# Patient Record
Sex: Female | Born: 1960 | Race: White | Hispanic: No | Marital: Married | Smoking: Never smoker
Health system: Southern US, Community
[De-identification: ages and names within clinical notes are randomized; demographics above are authoritative.]

## PROBLEM LIST (undated history)

## (undated) DIAGNOSIS — G43909 Migraine, unspecified, not intractable, without status migrainosus: Secondary | ICD-10-CM

## (undated) DIAGNOSIS — I10 Essential (primary) hypertension: Secondary | ICD-10-CM

## (undated) HISTORY — PX: OTHER SURGICAL HISTORY: SHX169

## (undated) HISTORY — DX: Migraine, unspecified, not intractable, without status migrainosus: G43.909

## (undated) HISTORY — DX: Essential (primary) hypertension: I10

---

## 2003-09-18 ENCOUNTER — Other Ambulatory Visit: Admission: RE | Admit: 2003-09-18 | Discharge: 2003-09-18 | Payer: Self-pay | Admitting: Obstetrics and Gynecology

## 2004-10-23 ENCOUNTER — Other Ambulatory Visit: Admission: RE | Admit: 2004-10-23 | Discharge: 2004-10-23 | Payer: Self-pay | Admitting: Obstetrics and Gynecology

## 2005-01-14 ENCOUNTER — Other Ambulatory Visit: Admission: RE | Admit: 2005-01-14 | Discharge: 2005-01-14 | Payer: Self-pay | Admitting: Obstetrics and Gynecology

## 2005-07-23 ENCOUNTER — Other Ambulatory Visit: Admission: RE | Admit: 2005-07-23 | Discharge: 2005-07-23 | Payer: Self-pay | Admitting: Obstetrics and Gynecology

## 2005-07-29 ENCOUNTER — Encounter: Admission: RE | Admit: 2005-07-29 | Discharge: 2005-07-29 | Payer: Self-pay | Admitting: Obstetrics and Gynecology

## 2011-06-20 ENCOUNTER — Other Ambulatory Visit: Payer: Self-pay | Admitting: Obstetrics and Gynecology

## 2011-06-20 DIAGNOSIS — R928 Other abnormal and inconclusive findings on diagnostic imaging of breast: Secondary | ICD-10-CM

## 2011-07-03 ENCOUNTER — Other Ambulatory Visit: Payer: Self-pay

## 2011-07-04 ENCOUNTER — Ambulatory Visit
Admission: RE | Admit: 2011-07-04 | Discharge: 2011-07-04 | Disposition: A | Discharge status: Home / Self Care | Payer: BC Managed Care – PPO | Source: Ambulatory Visit | Attending: Obstetrics and Gynecology | Admitting: Obstetrics and Gynecology

## 2011-07-04 DIAGNOSIS — R928 Other abnormal and inconclusive findings on diagnostic imaging of breast: Secondary | ICD-10-CM

## 2014-07-27 ENCOUNTER — Other Ambulatory Visit: Payer: Self-pay | Admitting: Cardiology

## 2014-07-27 DIAGNOSIS — R0609 Other forms of dyspnea: Secondary | ICD-10-CM

## 2014-07-27 DIAGNOSIS — R0989 Other specified symptoms and signs involving the circulatory and respiratory systems: Principal | ICD-10-CM

## 2014-08-02 ENCOUNTER — Encounter (INDEPENDENT_AMBULATORY_CARE_PROVIDER_SITE_OTHER): Payer: Self-pay

## 2014-08-02 ENCOUNTER — Ambulatory Visit
Admission: RE | Admit: 2014-08-02 | Discharge: 2014-08-02 | Disposition: A | Discharge status: Home / Self Care | Payer: No Typology Code available for payment source | Source: Ambulatory Visit | Attending: Cardiology | Admitting: Cardiology

## 2014-08-02 DIAGNOSIS — R0989 Other specified symptoms and signs involving the circulatory and respiratory systems: Principal | ICD-10-CM

## 2014-08-02 DIAGNOSIS — R0609 Other forms of dyspnea: Secondary | ICD-10-CM

## 2015-07-18 ENCOUNTER — Other Ambulatory Visit: Payer: Self-pay | Admitting: Obstetrics and Gynecology

## 2015-07-18 DIAGNOSIS — R928 Other abnormal and inconclusive findings on diagnostic imaging of breast: Secondary | ICD-10-CM

## 2015-07-23 ENCOUNTER — Ambulatory Visit
Admission: RE | Admit: 2015-07-23 | Discharge: 2015-07-23 | Disposition: A | Discharge status: Home / Self Care | Payer: BC Managed Care – PPO | Source: Ambulatory Visit | Attending: Obstetrics and Gynecology | Admitting: Obstetrics and Gynecology

## 2015-07-23 DIAGNOSIS — R928 Other abnormal and inconclusive findings on diagnostic imaging of breast: Secondary | ICD-10-CM

## 2015-07-23 LAB — HM MAMMOGRAPHY: HM Mammogram: ABNORMAL — AB (ref 0–4)

## 2015-07-25 ENCOUNTER — Ambulatory Visit: Payer: BC Managed Care – PPO | Admitting: Neurology

## 2015-09-09 IMAGING — CT CT HEART SCORING
1 of 2 series · 11 of 20 positions shown, 14 images · non-contrast
Comparison: None.

CLINICAL DATA: Shortness of breath. Family history of heart
disease.

EXAM:
CT HEART FOR CALCIUM SCORING
TECHNIQUE: CT heart was performed on a 256 channel system using prospective ECG
gating. A scout and noncontrast exam (for calcium scoring) were
performed. Note that this exam targets the heart and the chest was
not imaged in its entirety.

[Series 2: smartscore - gated 0.4 sec · axial · 0.49mm/px · z∈[-211,-96]mm · 11 of 56 slices shown, 14 images]
[im 5/56  vessel]
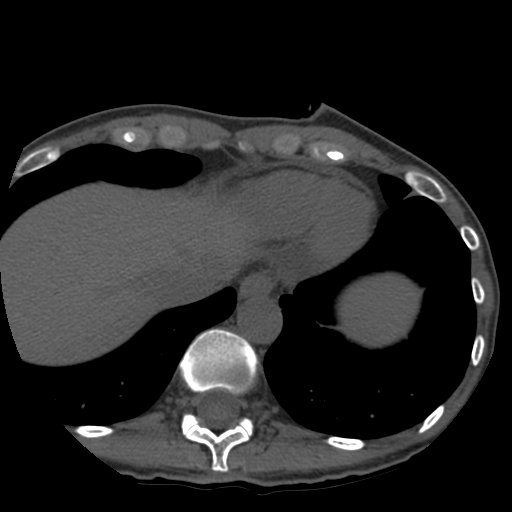
[im 5/56  lung]
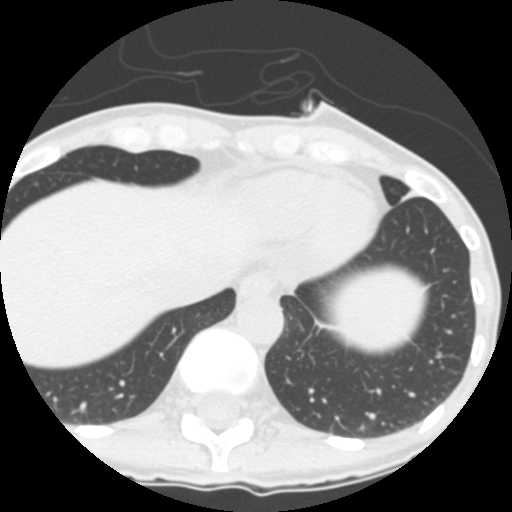
[im 10/56  vessel]
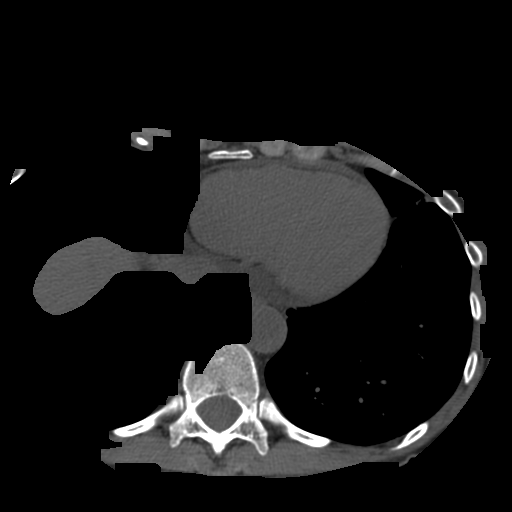
[im 14/56  vessel]
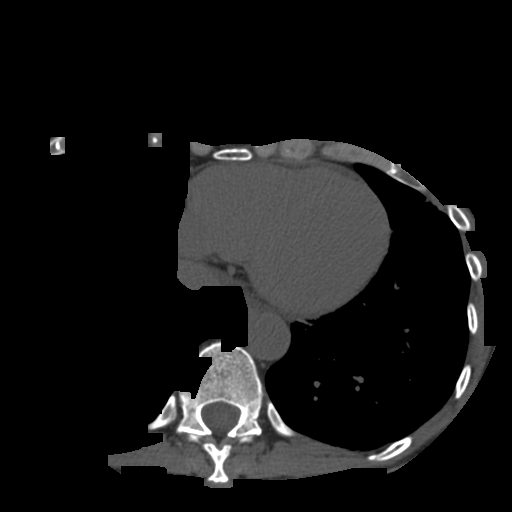
[im 19/56  vessel]
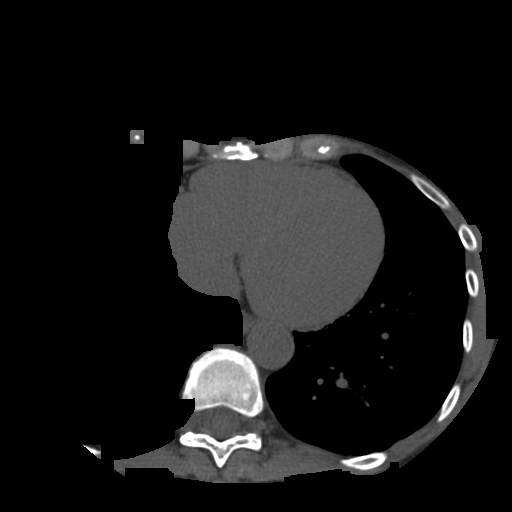
[im 23/56  vessel]
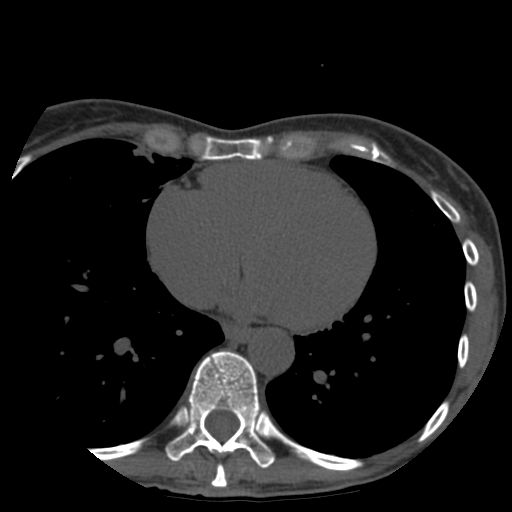
[im 23/56  lung]
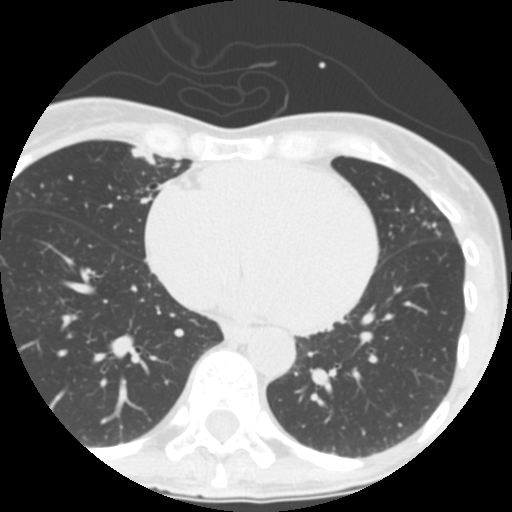
[im 28/56  vessel]
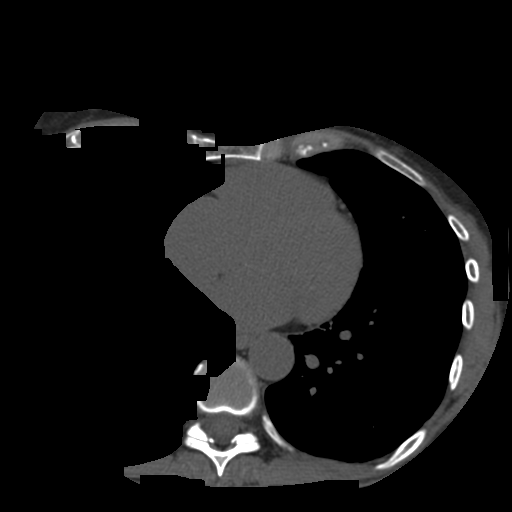
[im 33/56  vessel]
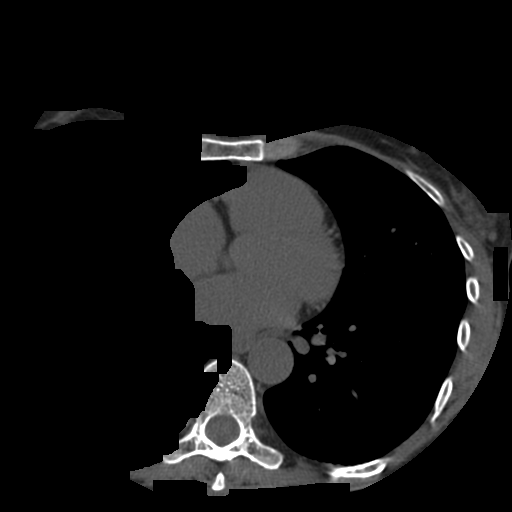
[im 37/56  vessel]
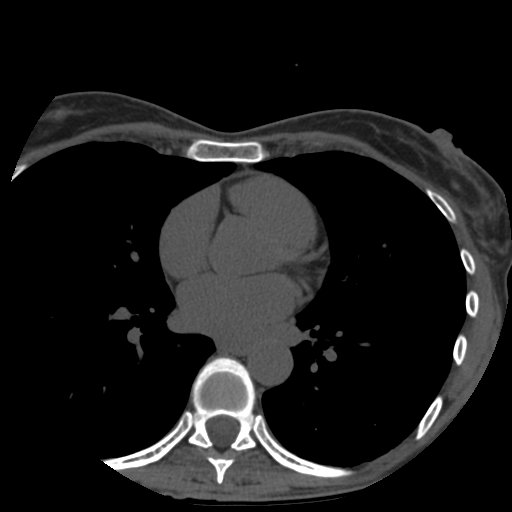
[im 42/56  vessel]
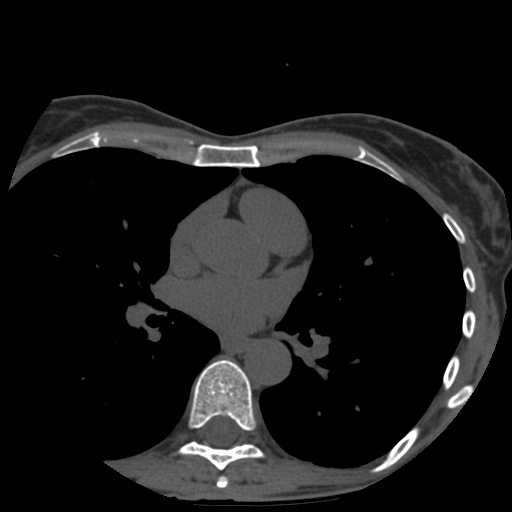
[im 42/56  lung]
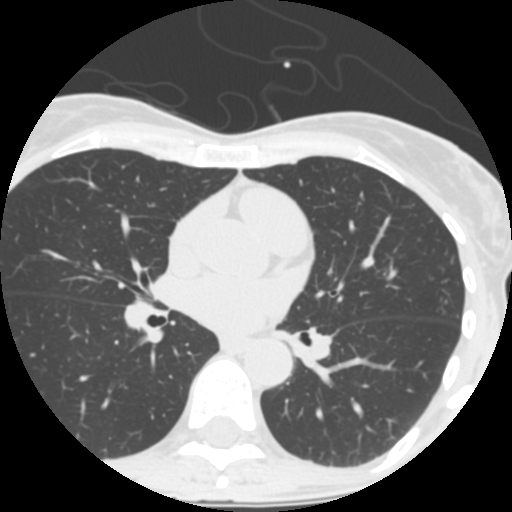
[im 46/56  vessel]
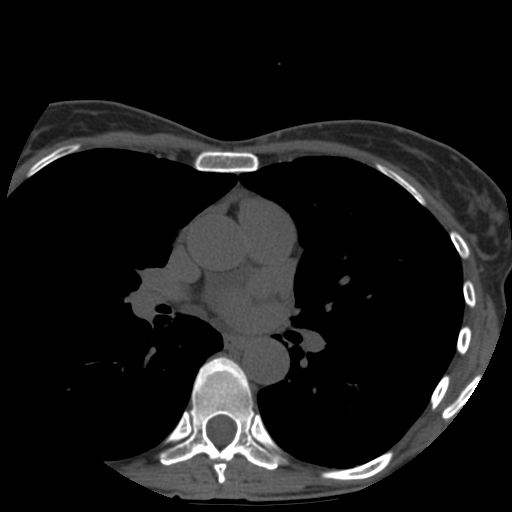
[im 51/56  vessel]
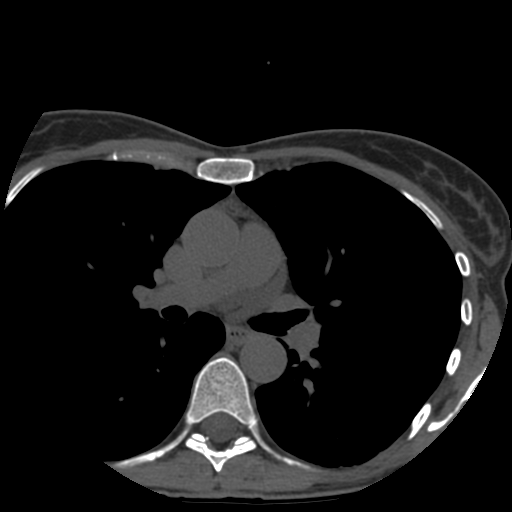

[11 of 20 positions shown; findings below may reference images not displayed]

FINDINGS: Technical quality: Good

CORONARY CALCIUM

Total Agatston Score: 0

AORTA AND PULMONARY MEASUREMENTS:

Aortic root (21 - 40 mm):

23 at the annulus

34 at the sinuses of Valsalva

25 at the sinotubular junction

Ascending aorta ( <  40 mm): 29

Descending aorta ( <  40 mm): 22

Main pulmonary artery:  ( <  30 mm): 21

Borderline cardiomegaly, without pericardial effusion.

EXTRACARDIAC FINDINGS:

Lung windows demonstrate clustered nodularity in the right middle
lobe and lingula with minimal bronchiectasis.

Soft tissue windows demonstrate no pleural fluid. No mediastinal or
definite hilar adenopathy, given limitations of unenhanced CT.

Limited abdominal imaging demonstrates normal imaged portions of the
liver and spleen.

No acute osseous abnormality.
IMPRESSION: 1. No coronary calcium identified.
2. Clustered nodularity in the right middle lobe and lingula with
minimal bronchiectasis. Favor the sequelae of atypical infection,
possibly chronic mycobacterium avium intracellular.

## 2020-09-21 ENCOUNTER — Ambulatory Visit (INDEPENDENT_AMBULATORY_CARE_PROVIDER_SITE_OTHER): Payer: BC Managed Care – PPO | Admitting: Medical-Surgical

## 2020-09-21 ENCOUNTER — Encounter: Payer: Self-pay | Admitting: Medical-Surgical

## 2020-09-21 VITALS — BP 122/78 | HR 64 | Temp 98.5°F | Ht 65.25 in | Wt 112.9 lb

## 2020-09-21 DIAGNOSIS — I499 Cardiac arrhythmia, unspecified: Secondary | ICD-10-CM

## 2020-09-21 DIAGNOSIS — F419 Anxiety disorder, unspecified: Secondary | ICD-10-CM

## 2020-09-21 DIAGNOSIS — Z23 Encounter for immunization: Secondary | ICD-10-CM | POA: Diagnosis not present

## 2020-09-21 DIAGNOSIS — Z114 Encounter for screening for human immunodeficiency virus [HIV]: Secondary | ICD-10-CM

## 2020-09-21 DIAGNOSIS — Z1159 Encounter for screening for other viral diseases: Secondary | ICD-10-CM

## 2020-09-21 DIAGNOSIS — Z1211 Encounter for screening for malignant neoplasm of colon: Secondary | ICD-10-CM

## 2020-09-21 DIAGNOSIS — Z7689 Persons encountering health services in other specified circumstances: Secondary | ICD-10-CM | POA: Diagnosis not present

## 2020-09-21 MED ORDER — HYDROXYZINE HCL 10 MG PO TABS: 10.0000 mg | ORAL_TABLET | Freq: Three times a day (TID) | ORAL | 0 refills | Status: AC | PRN

## 2020-09-21 MED ORDER — ESCITALOPRAM OXALATE 10 MG PO TABS: ORAL_TABLET | ORAL | 1 refills | Status: DC

## 2020-09-21 NOTE — Progress Notes (Signed)
New Patient Office Visit  Subjective:  Patient ID: Sierra Scott, female    DOB: 1961-09-30  Age: 59 y.o. MRN: [id number]  CC:  Chief Complaint  Patient presents with  . Establish Care  . Anxiety    GAD-7: 5, somewhat difficult; PHQ-9: 3, somewhat difficult    HPI Sierra Scott presents to establish care. 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 She plans to retire at the end of the year and is looking forward to it.   Today, she presents to discuss anxiety. She has two children and her daughter has been going through some significant life challenges. Her daughter lives in [place] but spends every weekend with her mother. With her daughter's challenges, Sierra Scott reports her daughter's mental health has been poor. Sierra Scott is experiencing some significant anxiety herself as she is constantly worried about taking care of her daughter. Endorses some difficulty sleeping at night. Has had episodes where she feels short of breath and has some chest tightness. Reports she was treated for a short while for anxiety/stress about 10 years ago when her brother died and she had to handle all of the arrangements while helping to care for her father who had a stroke. The only counseling she has done in the past was marriage counseling. Not interested in establishing a counseling relationship right now as she does not think it would be helpful since the problems bothering her all revolve around her worry for her daughter. Is interested in starting a medication to help manage the anxiety.  History reviewed. No pertinent past medical history.  History reviewed. No pertinent surgical history.  History reviewed. No pertinent family history.  Social History   Socioeconomic History  . Marital status: Married    Spouse name: Not on file  . Number of children: Not on file  . Years of education: Not on file  . Highest education level: Not on file  Occupational History  . Not on file   Tobacco Use  . Smoking status: Never Smoker  . Smokeless tobacco: Never Used  Substance and Sexual Activity  . Alcohol use: Yes    Alcohol/week: 1.0 - 2.0 standard drink    Types: 1 - 2 Glasses of wine per week  . Drug use: Never  . Sexual activity: Yes    Birth control/protection: Surgical, Post-menopausal    Comment: husband had a vasectomy  Other Topics Concern  . Not on file  Social History Narrative  . Not on file   Social Determinants of Health   Financial Resource Strain:   . Difficulty of Paying Living Expenses: Not on file  Food Insecurity:   . Worried About Programme researcher, broadcasting/film/video in the Last Year: Not on file  . Ran Out of Food in the Last Year: Not on file  Transportation Needs:   . Lack of Transportation (Medical): Not on file  . Lack of Transportation (Non-Medical): Not on file  Physical Activity:   . Days of Exercise per Week: Not on file  . Minutes of Exercise per Session: Not on file  Stress:   . Feeling of Stress : Not on file  Social Connections:   . Frequency of Communication with Friends and Family: Not on file  . Frequency of Social Gatherings with Friends and Family: Not on file  . Attends Religious Services: Not on file  . Active Member of Clubs or Organizations: Not on file  . Attends Banker Meetings: Not on file  .  Marital Status: Not on file  Intimate Partner Violence:   . Fear of Current or Ex-Partner: Not on file  . Emotionally Abused: Not on file  . Physically Abused: Not on file  . Sexually Abused: Not on file    ROS Review of Systems  Constitutional: Negative for chills, fatigue, fever and unexpected weight change.  HENT: Negative for congestion, sinus pressure and sore throat.   Eyes: Negative for visual disturbance.  Respiratory: Positive for chest tightness and shortness of breath. Negative for cough and wheezing.   Cardiovascular: Negative for chest pain, palpitations and leg swelling.  Gastrointestinal: Negative for  abdominal pain, constipation, diarrhea, nausea and vomiting.  Genitourinary: Negative for dysuria, frequency and urgency.  Allergic/Immunologic: Positive for environmental allergies (seasonal, takes local honey). Negative for food allergies.  Neurological: Negative for dizziness, seizures, weakness, light-headedness, numbness and headaches.  Hematological: Does not bruise/bleed easily.  Psychiatric/Behavioral: Positive for sleep disturbance (when stressed out). Negative for dysphoric mood, self-injury and suicidal ideas. The patient is nervous/anxious.     Objective:   Today's Vitals: BP 122/78   Pulse 64   Temp 98.5 F (36.9 C) (Oral)   Ht 5' 5.25" (1.657 m)   Wt 112 lb 14.4 oz (51.2 kg)   LMP 07/18/2014   SpO2 97%   BMI 18.64 kg/m   Physical Exam Vitals and nursing note reviewed.  Constitutional:      General: She is not in acute distress.    Appearance: Normal appearance.  HENT:     Head: Normocephalic and atraumatic.  Cardiovascular:     Rate and Rhythm: Normal rate. Rhythm irregular.     Pulses: Normal pulses.     Heart sounds: Normal heart sounds. No murmur heard.  No friction rub. No gallop.   Pulmonary:     Effort: Pulmonary effort is normal. No respiratory distress.     Breath sounds: Normal breath sounds.  Skin:    General: Skin is warm and dry.  Neurological:     Mental Status: She is alert and oriented to person, place, and time.  Psychiatric:        Mood and Affect: Mood normal.        Behavior: Behavior normal.        Thought Content: Thought content normal.        Judgment: Judgment normal.     Assessment & Plan:   1. Encounter to establish care Reviewed available information and discussed health concerns with patient. She is fairly up to date on preventative care with the exception of what we are taking care of today.   2. Anxiety Starting Lexapro 5mg  daily x 8 days then increase to 10mg  daily. Discussed possible side effects and expectations for  resolution and maximum efficiency. Also sending in a low dose hydroxyzine for as needed use in the even of severe anxiety or difficulty sleeping due to racing thoughts.   3. Need for Tdap vaccination Tdap given in office today. - Tdap vaccine greater than or equal to 7yo IM  4. Screening for colon cancer Has never had colon cancer screening. Discussed options. Ordering cologuard. Instructed patient to place it in her bathroom and complete it as soon as possible.  - Cologuard  5. Need for hepatitis C screening test Discussed screening recommendations. She is agreeable so adding on to blood work today.  - Hepatitis C antibody  6. Screening for HIV (human immunodeficiency virus) Discussed screening recommendations. She is agreeable to this as well. Adding to blood  work today.  - HIV Antibody (routine testing w rflx)  7. Need for shingles vaccine Shingrix given in office today. Next dose due in 2-6 months. Ok to schedule nurse visit for completion of the second vaccine.   8. Irregular heartbeat No prior history of heart problems or irregular pulse. EKG in office showing Normal sinus rhythm with occasional PVCs, normal axis. Checking CBC, CMP, and TSH today.   Outpatient Encounter Medications as of 09/21/2020  Medication Sig  . ascorbic acid (VITAMIN C) 500 MG tablet Take 1 tablet by mouth daily.  . butalbital-acetaminophen-caffeine (FIORICET) 50-325-40 MG tablet Take 1 tablet by mouth daily as needed.  . calcium carbonate (TUMS EX) 750 MG chewable tablet Chew 1 tablet by mouth daily.  . Cholecalciferol 25 MCG (1000 UT) capsule Take 1 capsule by mouth daily.  . hydrochlorothiazide (HYDRODIURIL) 25 MG tablet Take 25 mg by mouth daily.  . RESTASIS 0.05 % ophthalmic emulsion Place 1 drop into both eyes 2 (two) times daily.  Marland Kitchen UNABLE TO FIND Med Name: Stress J herbal supplement, 3 tablets oral daily as needed for anxiety  . escitalopram (LEXAPRO) 10 MG tablet Take 5mg  (1/2 tablet) daily for 8  days then increase to 10mg  (1 tablet) daily.  . hydrOXYzine (ATARAX/VISTARIL) 10 MG tablet Take 1-2 tablets (10-20 mg total) by mouth 3 (three) times daily as needed for anxiety.   No facility-administered encounter medications on file as of 09/21/2020.    Follow-up: Return in about 4 weeks (around 10/19/2020) for mood follow up.   09/23/2020, DNP, APRN, FNP-BC [facility]

## 2020-09-21 NOTE — Patient Instructions (Addendum)
https://www.cdc.gov/vaccines/hcp/vis/vis-statements/tdap.pdf">  Tdap (Tetanus, Diphtheria, Pertussis) Vaccine: What You Need to Know 1. Why get vaccinated? Tdap vaccine can prevent tetanus, diphtheria, and pertussis. Diphtheria and pertussis spread from person to person. Tetanus enters the body through cuts or wounds.  TETANUS (T) causes painful stiffening of the muscles. Tetanus can lead to serious health problems, including being unable to open the mouth, having trouble swallowing and breathing, or death.  DIPHTHERIA (D) can lead to difficulty breathing, heart failure, paralysis, or death.  PERTUSSIS (aP), also known as "whooping cough," can cause uncontrollable, violent coughing which makes it hard to breathe, eat, or drink. Pertussis can be extremely serious in babies and young children, causing pneumonia, convulsions, brain damage, or death. In teens and adults, it can cause weight loss, loss of bladder control, passing out, and rib fractures from severe coughing. 2. Tdap vaccine Tdap is only for children 7 years and older, adolescents, and adults.  Adolescents should receive a single dose of Tdap, preferably at age 53 or 35 years. Pregnant women should get a dose of Tdap during every pregnancy, to protect the newborn from pertussis. Infants are most at risk for severe, life-threatening complications from pertussis. Adults who have never received Tdap should get a dose of Tdap. Also, adults should receive a booster dose every 10 years, or earlier in the case of a severe and dirty wound or burn. Booster doses can be either Tdap or Td (a different vaccine that protects against tetanus and diphtheria but not pertussis). Tdap may be given at the same time as other vaccines. 3. Talk with your health care provider Tell your vaccine provider if the person getting the vaccine:  Has had an allergic reaction after a previous dose of any vaccine that protects against tetanus, diphtheria, or pertussis,  or has any severe, life-threatening allergies.  Has had a coma, decreased level of consciousness, or prolonged seizures within 7 days after a previous dose of any pertussis vaccine (DTP, DTaP, or Tdap).  Has seizures or another nervous system problem.  Has ever had Guillain-Barr Syndrome (also called GBS).  Has had severe pain or swelling after a previous dose of any vaccine that protects against tetanus or diphtheria. In some cases, your health care provider may decide to postpone Tdap vaccination to a future visit.  People with minor illnesses, such as a cold, may be vaccinated. People who are moderately or severely ill should usually wait until they recover before getting Tdap vaccine.  Your health care provider can give you more information. 4. Risks of a vaccine reaction  Pain, redness, or swelling where the shot was given, mild fever, headache, feeling tired, and nausea, vomiting, diarrhea, or stomachache sometimes happen after Tdap vaccine. People sometimes faint after medical procedures, including vaccination. Tell your provider if you feel dizzy or have vision changes or ringing in the ears.  As with any medicine, there is a very remote chance of a vaccine causing a severe allergic reaction, other serious injury, or death. 5. What if there is a serious problem? An allergic reaction could occur after the vaccinated person leaves the clinic. If you see signs of a severe allergic reaction (hives, swelling of the face and throat, difficulty breathing, a fast heartbeat, dizziness, or weakness), call 9-1-1 and get the person to the nearest hospital. For other signs that concern you, call your health care provider.  Adverse reactions should be reported to the Vaccine Adverse Event Reporting System (VAERS). Your health care provider will usually file this report,  or you can do it yourself. Visit the VAERS website at www.vaers.LAgents.no or call 872-534-0012. VAERS is only for reporting  reactions, and VAERS staff do not give medical advice. 6. The National Vaccine Injury Compensation Program The [employer] Vaccine Injury Compensation Program (VICP) is a federal program that was created to compensate people who may have been injured by certain vaccines. Visit the VICP website at SpiritualWord.at or call (781) 718-9402 to learn about the program and about filing a claim. There is a time limit to file a claim for compensation. 7. How can I learn more?  Ask your health care provider.  Call your local or state health department.  Contact the Centers for Disease Control and Prevention (CDC): ? Call 319-040-1231 (1-800-CDC-INFO) or ? Visit CDC's website at PicCapture.uy Vaccine Information Statement Tdap (Tetanus, Diphtheria, Pertussis) Vaccine (03/02/2019) This information is not intended to replace advice given to you by your health care provider. Make sure you discuss any questions you have with your health care provider. Document Revised: 03/11/2019 Document Reviewed: 03/14/2019 Elsevier Patient Education  2020 Else[employer] Inc.  Recombinant Zoster (Shingles) Vaccine: What You Need to Know 1. Why get vaccinated? Recombinant zoster (shingles) vaccine can prevent shingles. Shingles (also called herpes zoster, or just zoster) is a painful skin rash, usually with blisters. In addition to the rash, shingles can cause fever, headache, chills, or upset stomach. More rarely, shingles can lead to pneumonia, hearing problems, blindness, brain inflammation (encephalitis), or death. The most common complication of shingles is long-term nerve pain called postherpetic neuralgia (PHN). PHN occurs in the areas where the shingles rash was, even after the rash clears up. It can last for months or years after the rash goes away. The pain from PHN can be severe and debilitating. About 10 to 18% of people who get shingles will experience PHN. The risk of PHN increases with age. An  older adult with shingles is more likely to develop PHN and have longer lasting and more severe pain than a younger person with shingles. Shingles is caused by the varicella zoster virus, the same virus that causes chickenpox. After you have chickenpox, the virus stays in your body and can cause shingles later in life. Shingles cannot be passed from one person to another, but the virus that causes shingles can spread and cause chickenpox in someone who had never had chickenpox or received chickenpox vaccine. 2. Recombinant shingles vaccine Recombinant shingles vaccine provides strong protection against shingles. By preventing shingles, recombinant shingles vaccine also protects against PHN. Recombinant shingles vaccine is the preferred vaccine for the prevention of shingles. However, a different vaccine, live shingles vaccine, may be used in some circumstances. The recombinant shingles vaccine is recommended for adults 50 years and older without serious immune problems. It is given as a two-dose series. This vaccine is also recommended for people who have already gotten another type of shingles vaccine, the live shingles vaccine. There is no live virus in this vaccine. Shingles vaccine may be given at the same time as other vaccines. 3. Talk with your health care provider Tell your vaccine provider if the person getting the vaccine:  Has had an allergic reaction after a previous dose of recombinant shingles vaccine, or has any severe, life-threatening allergies.  Is pregnant or breastfeeding.  Is currently experiencing an episode of shingles. In some cases, your health care provider may decide to postpone shingles vaccination to a future visit. People with minor illnesses, such as a cold, may be vaccinated. People who are moderately  or severely ill should usually wait until they recover before getting recombinant shingles vaccine. Your health care provider can give you more information. 4. Risks  of a vaccine reaction  A sore arm with mild or moderate pain is very common after recombinant shingles vaccine, affecting about 80% of vaccinated people. Redness and swelling can also happen at the site of the injection.  Tiredness, muscle pain, headache, shivering, fever, stomach pain, and nausea happen after vaccination in more than half of people who receive recombinant shingles vaccine. In clinical trials, about 1 out of 6 people who got recombinant zoster vaccine experienced side effects that prevented them from doing regular activities. Symptoms usually went away on their own in 2 to 3 days. You should still get the second dose of recombinant zoster vaccine even if you had one of these reactions after the first dose. People sometimes faint after medical procedures, including vaccination. Tell your provider if you feel dizzy or have vision changes or ringing in the ears. As with any medicine, there is a very remote chance of a vaccine causing a severe allergic reaction, other serious injury, or death. 5. What if there is a serious problem? An allergic reaction could occur after the vaccinated person leaves the clinic. If you see signs of a severe allergic reaction (hives, swelling of the face and throat, difficulty breathing, a fast heartbeat, dizziness, or weakness), call 9-1-1 and get the person to the nearest hospital. For other signs that concern you, call your health care provider. Adverse reactions should be reported to the Vaccine Adverse Event Reporting System (VAERS). Your health care provider will usually file this report, or you can do it yourself. Visit the VAERS website at www.vaers.LAgents.no or call (641)113-7816. VAERS is only for reporting reactions, and VAERS staff do not give medical advice. 6. How can I learn more?  Ask your health care provider.  Call your local or state health department.  Contact the Centers for Disease Control and Prevention (CDC): ? Call 406-197-5763  (1-800-CDC-INFO) or ? Visit CDC's website at PicCapture.uy Vaccine Information Statement Recombinant Zoster Vaccine (09/29/2018) This information is not intended to replace advice given to you by your health care provider. Make sure you discuss any questions you have with your health care provider. Document Revised: 03/08/2019 Document Reviewed: 06/23/2018 Elsevier Patient Education  2020 [employer].

## 2020-09-22 LAB — COMPLETE METABOLIC PANEL WITH GFR
AG Ratio: 1.6 (calc) (ref 1.0–2.5)
ALT: 6 U/L (ref 6–29)
AST: 18 U/L (ref 10–35)
Albumin: 4.2 g/dL (ref 3.6–5.1)
Alkaline phosphatase (APISO): 45 U/L (ref 37–153)
BUN: 16 mg/dL (ref 7–25)
CO2: 30 mmol/L (ref 20–32)
Calcium: 9 mg/dL (ref 8.6–10.4)
Chloride: 98 mmol/L (ref 98–110)
Creat: 0.75 mg/dL (ref 0.50–1.05)
GFR, Est African American: 101 mL/min/{1.73_m2} (ref >=60)
GFR, Est Non African American: 87 mL/min/{1.73_m2} (ref >=60)
Globulin: 2.7 g/dL (calc) (ref 1.9–3.7)
Glucose, Bld: 93 mg/dL (ref 65–139)
Potassium: 3.7 mmol/L (ref 3.5–5.3)
Sodium: 134 mmol/L — ABNORMAL LOW (ref 135–146)
Total Bilirubin: 0.2 mg/dL (ref 0.2–1.2)
Total Protein: 6.9 g/dL (ref 6.1–8.1)

## 2020-09-22 LAB — CBC
HCT: 40.2 % (ref 35.0–45.0)
Hemoglobin: 13.4 g/dL (ref 11.7–15.5)
MCH: 30 pg (ref 27.0–33.0)
MCHC: 33.3 g/dL (ref 32.0–36.0)
MCV: 89.9 fL (ref 80.0–100.0)
MPV: 12.6 fL — ABNORMAL HIGH (ref 7.5–12.5)
Platelets: 169 10*3/uL (ref 140–400)
RBC: 4.47 10*6/uL (ref 3.80–5.10)
RDW: 12.5 % (ref 11.0–15.0)
WBC: 5.6 10*3/uL (ref 3.8–10.8)

## 2020-09-22 LAB — TSH: TSH: 1.32 mIU/L (ref 0.40–4.50)

## 2020-10-14 LAB — COLOGUARD: Cologuard: NEGATIVE

## 2020-10-18 ENCOUNTER — Encounter: Payer: Self-pay | Admitting: Medical-Surgical

## 2020-10-18 ENCOUNTER — Ambulatory Visit (INDEPENDENT_AMBULATORY_CARE_PROVIDER_SITE_OTHER): Payer: BC Managed Care – PPO | Admitting: Medical-Surgical

## 2020-10-18 ENCOUNTER — Other Ambulatory Visit: Payer: Self-pay

## 2020-10-18 VITALS — BP 126/81 | HR 62 | Temp 98.0°F | Ht 65.25 in | Wt 114.4 lb

## 2020-10-18 DIAGNOSIS — F419 Anxiety disorder, unspecified: Secondary | ICD-10-CM

## 2020-10-18 DIAGNOSIS — I499 Cardiac arrhythmia, unspecified: Secondary | ICD-10-CM | POA: Diagnosis not present

## 2020-10-18 NOTE — Progress Notes (Signed)
Subjective:    CC: Mood follow-up  HPI: Pleasant 59 year old female presenting today for mood follow-up.  She was started on Lexapro 5 mg with the instructions to increase to 10 mg daily after the first 8 days.  She notes that she has stayed at the 5 mg dose and is doing very well on it.  Tolerating the medication without side effects.  No alterations in her sleep pattern or appetite.  Feels the medication has helped quite a bit.  Does admit to still worrying about various things but her level of worry is not overwhelming at this time.  Denies SI/HI.  Is still concerned about her irregular heartbeat discovered at our first appointment.  Her EKG was reassuring with only occasional PVCs/PACs but she does have a very strong family history of heart disease on both sides of her family.  Since she became aware of her irregular heartbeat, she has had some intermittent transient symptoms such as a bit of lightheadedness with rising, a mild chest pain that resolves, etc.  Denies headaches, shortness of breath, lower extremity edema, and palpitations.  I reviewed the past medical history, family history, social history, surgical history, and allergies today and no changes were needed.  Please see the problem list section below in epic for further details.  Past Medical History: History reviewed. No pertinent past medical history. Past Surgical History: History reviewed. No pertinent surgical history. Social History: Social History   Socioeconomic History  . Marital status: Married    Spouse name: Not on file  . Number of children: Not on file  . Years of education: Not on file  . Highest education level: Not on file  Occupational History  . Not on file  Tobacco Use  . Smoking status: Never Smoker  . Smokeless tobacco: Never Used  Substance and Sexual Activity  . Alcohol use: Yes    Alcohol/week: 1.0 - 2.0 standard drink    Types: 1 - 2 Glasses of wine per week  . Drug use: Never  . Sexual  activity: Yes    Birth control/protection: Surgical, Post-menopausal    Comment: husband had a vasectomy  Other Topics Concern  . Not on file  Social History Narrative  . Not on file   Social Determinants of Health   Financial Resource Strain:   . Difficulty of Paying Living Expenses: Not on file  Food Insecurity:   . Worried About Programme researcher, broadcasting/film/video in the Last Year: Not on file  . Ran Out of Food in the Last Year: Not on file  Transportation Needs:   . Lack of Transportation (Medical): Not on file  . Lack of Transportation (Non-Medical): Not on file  Physical Activity:   . Days of Exercise per Week: Not on file  . Minutes of Exercise per Session: Not on file  Stress:   . Feeling of Stress : Not on file  Social Connections:   . Frequency of Communication with Friends and Family: Not on file  . Frequency of Social Gatherings with Friends and Family: Not on file  . Attends Religious Services: Not on file  . Active Member of Clubs or Organizations: Not on file  . Attends Banker Meetings: Not on file  . Marital Status: Not on file   Family History: History reviewed. No pertinent family history. Allergies: No Known Allergies Medications: See med rec.  Review of Systems: See HPI for pertinent positives and negatives.  Depression screen [facility] 2/9 09/21/2020  Decreased Interest  1  Down, Depressed, Hopeless 0  PHQ - 2 Score 1  Altered sleeping 1  Tired, decreased energy 1  Change in appetite 0  Feeling bad or failure about yourself  0  Trouble concentrating 0  Moving slowly or fidgety/restless 0  Suicidal thoughts 0  PHQ-9 Score 3  Difficult doing work/chores Somewhat difficult   GAD 7 : Generalized Anxiety Score 09/21/2020  Nervous, Anxious, on Edge 1  Control/stop worrying 1  Worry too much - different things 1  Trouble relaxing 1  Restless 0  Easily annoyed or irritable 0  Afraid - awful might happen 1  Total GAD 7 Score 5  Anxiety Difficulty  Somewhat difficult   Objective:    General: Well Developed, well nourished, and in no acute distress.  Neuro: Alert and oriented x3.  HEENT: Normocephalic, atraumatic.  Skin: Warm and dry. Cardiac: Regular rate with intermittent irregular rhythm , no murmurs rubs or gallops, no lower extremity edema.  Respiratory: Clear to auscultation bilaterally. Not using accessory muscles, speaking in full sentences.  Impression and Recommendations:    1. Anxiety Continue Lexapro 5 mg daily as she feels this is helpful for her and does not want to take more medicine than needed.  2. Irregular heart beat Since this is significant because of worry for her in the setting of a strong family history, referring to cardiology for further evaluation at patient's request. - Ambulatory referral to Cardiology  Return in about 3 months (around 01/18/2021) for mood follow up. ___________________________________________ Thayer Ohm, DNP, APRN, FNP-BC Primary Care and Sports Medicine [facility] [place]

## 2020-11-17 ENCOUNTER — Other Ambulatory Visit: Payer: Self-pay | Admitting: Medical-Surgical

## 2020-12-10 NOTE — Progress Notes (Signed)
Referring-Sierra Larinda Buttery, NP Reason for referral-Abnormal ECG  HPI: 60 year old female for evaluation of Abnormal ECG at request of Christen Butter, NP.  Calcium score September 2015 0. Labs 10/21: TSH-1.32, K 3.7.  Patient is very active.  She has dyspnea when she is exercising or going up stairs but is otherwise asymptomatic.  There is no orthopnea, PND, pedal edema, palpitations, syncope or chest pain.  Occasional dizziness with standing.  She was noted to have premature beats on her ECG and cardiology asked to evaluate.  Current Outpatient Medications  Medication Sig Dispense Refill  . ascorbic acid (VITAMIN C) 500 MG tablet Take 1 tablet by mouth daily.    . butalbital-acetaminophen-caffeine (FIORICET) 50-325-40 MG tablet Take 1 tablet by mouth daily as needed.    . calcium carbonate (TUMS EX) 750 MG chewable tablet Chew 1 tablet by mouth daily.    . Cholecalciferol 25 MCG (1000 UT) capsule Take 1 capsule by mouth daily.    Marland Kitchen escitalopram (LEXAPRO) 5 MG tablet Take 1 tablet (5 mg total) by mouth daily. 90 tablet 0  . hydrochlorothiazide (HYDRODIURIL) 25 MG tablet Take 25 mg by mouth daily.    . hydrOXYzine (ATARAX/VISTARIL) 10 MG tablet Take 1-2 tablets (10-20 mg total) by mouth 3 (three) times daily as needed for anxiety. 60 tablet 0  . RESTASIS 0.05 % ophthalmic emulsion Place 1 drop into both eyes 2 (two) times daily.    Marland Kitchen UNABLE TO FIND Med Name: Stress J herbal supplement, 3 tablets oral daily as needed for anxiety     No current facility-administered medications for this visit.    No Known Allergies   Past Medical History:  Diagnosis Date  . Hypertension   . Migraine     Past Surgical History:  Procedure Laterality Date  . No prior surgery      Social History   Socioeconomic History  . Marital status: Married    Spouse name: Not on file  . Number of children: 2  . Years of education: Not on file  . Highest education level: Not on file  Occupational History     Comment: Teacher  Tobacco Use  . Smoking status: Never Smoker  . Smokeless tobacco: Never Used  Substance and Sexual Activity  . Alcohol use: Yes    Alcohol/week: 1.0 - 2.0 standard drink    Types: 1 - 2 Glasses of wine per week    Comment: Occasional  . Drug use: Never  . Sexual activity: Yes    Birth control/protection: Surgical, Post-menopausal    Comment: husband had a vasectomy  Other Topics Concern  . Not on file  Social History Narrative  . Not on file   Social Determinants of Health   Financial Resource Strain: Not on file  Food Insecurity: Not on file  Transportation Needs: Not on file  Physical Activity: Not on file  Stress: Not on file  Social Connections: Not on file  Intimate Partner Violence: Not on file    Family History  Problem Relation Age of Onset  . CAD Mother   . CAD Father   . Stroke Father     ROS: no fevers or chills, productive cough, hemoptysis, dysphasia, odynophagia, melena, hematochezia, dysuria, hematuria, rash, seizure activity, orthopnea, PND, pedal edema, claudication. Remaining systems are negative.  Physical Exam:   Blood pressure 110/80, pulse 67, height 5\' 6"  (1.676 m), weight 116 lb 12.8 oz (53 kg), last menstrual period 07/18/2014.  General:  Well developed/well nourished in  NAD Skin warm/dry Patient not depressed No peripheral clubbing Back-normal HEENT-normal/normal eyelids Neck supple/normal carotid upstroke bilaterally; no bruits; no JVD; no thyromegaly chest - CTA/ normal expansion CV - RRR/normal S1 and S2; no murmurs, rubs or gallops;  PMI nondisplaced Abdomen -NT/ND, no HSM, no mass, + bowel sounds, no bruit 2+ femoral pulses, no bruits Ext-no edema, chords, 2+ DP Neuro-grossly nonfocal  ECG -09/21/2020-sinus rhythm with PACs and PVCs and nonspecific ST changes.  Personally reviewed  Electrocardiogram today shows sinus rhythm with PACs, nonspecific ST changes.  A/P  1 abnormal electrocardiogram-patient is  noted to have frequent PACs and occasional PVCs on her ECG.  She is asymptomatic with no palpitations.  I will arrange an echocardiogram to assess LV function.  If normal we will not pursue further evaluation.  Note TSH is normal.  2 family history of coronary artery disease-I will arrange a calcium score for risk stratification.  We will also check lipids.  3 hypertension-patient's blood pressure is controlled.  Continue present medications and follow.    Olga Millers, MD

## 2020-12-19 ENCOUNTER — Encounter: Payer: Self-pay | Admitting: Cardiology

## 2020-12-19 ENCOUNTER — Ambulatory Visit (INDEPENDENT_AMBULATORY_CARE_PROVIDER_SITE_OTHER): Payer: BC Managed Care – PPO | Admitting: Cardiology

## 2020-12-19 ENCOUNTER — Other Ambulatory Visit: Payer: Self-pay

## 2020-12-19 VITALS — BP 110/80 | HR 67 | Ht 66.0 in | Wt 116.8 lb

## 2020-12-19 DIAGNOSIS — I10 Essential (primary) hypertension: Secondary | ICD-10-CM | POA: Diagnosis not present

## 2020-12-19 DIAGNOSIS — R9431 Abnormal electrocardiogram [ECG] [EKG]: Secondary | ICD-10-CM | POA: Diagnosis not present

## 2020-12-19 NOTE — Patient Instructions (Signed)
Medication Instructions:  Your physician recommends that you continue on your current medications as directed. Please refer to the Current Medication list given to you today.  *If you need a refill on your cardiac medications before your next appointment, please call your pharmacy*  Lab Work: Your physician recommends that you return for lab work :    Fasting Lipid Panel-DO NOT EAT OR DRINK PAST MIDNIGHT   If you have labs (blood work) drawn today and your tests are completely normal, you will receive your results only by: Marland Kitchen MyChart Message (if you have MyChart) OR . A paper copy in the mail If you have any lab test that is abnormal or we need to change your treatment, we will call you to review the results.  Testing/Procedures: Your physician has requested that you have an echocardiogram. Echocardiography is a painless test that uses sound waves to create images of your heart. It provides your doctor with information about the size and shape of your heart and how well your heart's chambers and valves are working. This procedure takes approximately one hour. There are no restrictions for this procedure. This test is performed at [address] [facility] [address]   Dr. Jens Som has ordered a CT coronary calcium score. This test is done at [address]. This is $99 out of pocket.   Coronary CalciumScan A coronary calcium scan is an imaging test used to look for deposits of calcium and other fatty materials (plaques) in the inner lining of the blood vessels of the heart (coronary arteries). These deposits of calcium and plaques can partly clog and narrow the coronary arteries without producing any symptoms or warning signs. This puts a person at risk for a heart attack. This test can detect these deposits before symptoms develop. Tell a health care provider about:  Any allergies you have.  All medicines you are taking, including vitamins, herbs, eye drops, creams, and  over-the-counter medicines.  Any problems you or family members have had with anesthetic medicines.  Any blood disorders you have.  Any surgeries you have had.  Any medical conditions you have.  Whether you are pregnant or may be pregnant. What are the risks? Generally, this is a safe procedure. However, problems may occur, including:  Harm to a pregnant woman and her unborn baby. This test involves the use of radiation. Radiation exposure can be dangerous to a pregnant woman and her unborn baby. If you are pregnant, you generally should not have this procedure done.  Slight increase in the risk of cancer. This is because of the radiation involved in the test. What happens before the procedure? No preparation is needed for this procedure. What happens during the procedure?  You will undress and remove any jewelry around your neck or chest.  You will put on a hospital gown.  Sticky electrodes will be placed on your chest. The electrodes will be connected to an electrocardiogram (ECG) machine to record a tracing of the electrical activity of your heart.  A CT scanner will take pictures of your heart. During this time, you will be asked to lie still and hold your breath for 2-3 seconds while a picture of your heart is being taken. The procedure may vary among health care providers and hospitals. What happens after the procedure?  You can get dressed.  You can return to your normal activities.  It is up to you to get the results of your test. Ask your health care  provider, or the department that is doing the test, when your results will be ready. Summary  A coronary calcium scan is an imaging test used to look for deposits of calcium and other fatty materials (plaques) in the inner lining of the blood vessels of the heart (coronary arteries).  Generally, this is a safe procedure. Tell your health care provider if you are pregnant or may be pregnant.  No preparation is needed for  this procedure.  A CT scanner will take pictures of your heart.  You can return to your normal activities after the scan is done. This information is not intended to replace advice given to you by your health care provider. Make sure you discuss any questions you have with your health care provider. Document Released: 05/15/2008 Document Revised: 10/06/2016 Document Reviewed: 10/06/2016 Elsevier Interactive Patient Education  2017 [employer].   Follow-Up: At [facility], you and your health needs are our priority.  As part of our continuing mission to provide you with exceptional heart care, we have created designated Provider Care Teams.  These Care Teams include your primary Cardiologist (physician) and Advanced Practice Providers (APPs -  Physician Assistants and Nurse Practitioners) who all work together to provide you with the care you need, when you need it.  Your next appointment:   As Needed   The format for your next appointment:   In Person  Provider:   Olga Millers, MD  Other Instructions

## 2020-12-20 ENCOUNTER — Telehealth: Payer: Self-pay | Admitting: Cardiology

## 2020-12-20 NOTE — Telephone Encounter (Signed)
Spoke with patient regarding scheduled appointment  for Calcium scoring and Echocardiogram ordered by Dr. Jens Som.  Calcium scoring scheduled Friday 01/11/21 at 10:15 am at [address], [address]---Echo scheduled 01/11/21 at 10:35 am --[address].  Will mail information to patient and she voiced her understanding.

## 2021-01-11 ENCOUNTER — Other Ambulatory Visit: Payer: Self-pay

## 2021-01-11 ENCOUNTER — Ambulatory Visit (HOSPITAL_COMMUNITY): Payer: BC Managed Care – PPO | Attending: Cardiology

## 2021-01-11 ENCOUNTER — Ambulatory Visit (INDEPENDENT_AMBULATORY_CARE_PROVIDER_SITE_OTHER)
Admission: RE | Admit: 2021-01-11 | Discharge: 2021-01-11 | Disposition: A | Discharge status: Home / Self Care | Payer: Self-pay | Source: Ambulatory Visit | Attending: Cardiology | Admitting: Cardiology

## 2021-01-11 DIAGNOSIS — R9431 Abnormal electrocardiogram [ECG] [EKG]: Secondary | ICD-10-CM | POA: Diagnosis not present

## 2021-01-11 DIAGNOSIS — I10 Essential (primary) hypertension: Secondary | ICD-10-CM

## 2021-01-11 LAB — ECHOCARDIOGRAM COMPLETE
Area-P 1/2: 2.96 cm2
S' Lateral: 3.3 cm

## 2021-01-18 ENCOUNTER — Ambulatory Visit: Payer: BC Managed Care – PPO | Admitting: Medical-Surgical

## 2021-01-25 LAB — HM PAP SMEAR: HM Pap smear: NEGATIVE

## 2021-06-17 ENCOUNTER — Telehealth: Payer: Self-pay | Admitting: Medical-Surgical

## 2021-06-17 NOTE — Telephone Encounter (Signed)
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 Please advise.

## 2021-06-18 NOTE — Telephone Encounter (Signed)
Appointment made for patient. No further questions at this time.

## 2021-06-24 ENCOUNTER — Other Ambulatory Visit: Payer: Self-pay

## 2021-06-24 ENCOUNTER — Ambulatory Visit (INDEPENDENT_AMBULATORY_CARE_PROVIDER_SITE_OTHER): Payer: BC Managed Care – PPO | Admitting: Medical-Surgical

## 2021-06-24 VITALS — BP 120/58 | HR 50

## 2021-06-24 DIAGNOSIS — Z111 Encounter for screening for respiratory tuberculosis: Secondary | ICD-10-CM | POA: Diagnosis not present

## 2021-06-24 NOTE — Progress Notes (Signed)
Established Patient Office Visit  Subjective:  Patient ID: Sierra Scott, female    DOB: 19-Nov-1961  Age: 60 y.o. MRN: [id number]  CC:  Chief Complaint  Patient presents with   PPD Placement    HPI Sierra Scott presents for PPD placement.   Past Medical History:  Diagnosis Date   Hypertension    Migraine     Past Surgical History:  Procedure Laterality Date   No prior surgery      Family History  Problem Relation Age of Onset   CAD Mother    CAD Father    Stroke Father     Social History   Socioeconomic History   Marital status: Married    Spouse name: Not on file   Number of children: 2   Years of education: Not on file   Highest education level: Not on file  Occupational History    Comment: Teacher  Tobacco Use   Smoking status: Never   Smokeless tobacco: Never  Substance and Sexual Activity   Alcohol use: Yes    Alcohol/week: 1.0 - 2.0 standard drink    Types: 1 - 2 Glasses of wine per week    Comment: Occasional   Drug use: Never   Sexual activity: Yes    Birth control/protection: Surgical, Post-menopausal    Comment: husband had a vasectomy  Other Topics Concern   Not on file  Social History Narrative   Not on file   Social Determinants of Health   Financial Resource Strain: Not on file  Food Insecurity: Not on file  Transportation Needs: Not on file  Physical Activity: Not on file  Stress: Not on file  Social Connections: Not on file  Intimate Partner Violence: Not on file    Outpatient Medications Prior to Visit  Medication Sig Dispense Refill   ascorbic acid (VITAMIN C) 500 MG tablet Take 1 tablet by mouth daily.     butalbital-acetaminophen-caffeine (FIORICET) 50-325-40 MG tablet Take 1 tablet by mouth daily as needed.     calcium carbonate (TUMS EX) 750 MG chewable tablet Chew 1 tablet by mouth daily.     Cholecalciferol 25 MCG (1000 UT) capsule Take 1 capsule by mouth daily.     escitalopram (LEXAPRO) 5 MG tablet  Take 1 tablet (5 mg total) by mouth daily. 90 tablet 0   hydrochlorothiazide (HYDRODIURIL) 25 MG tablet Take 25 mg by mouth daily.     hydrOXYzine (ATARAX/VISTARIL) 10 MG tablet Take 1-2 tablets (10-20 mg total) by mouth 3 (three) times daily as needed for anxiety. 60 tablet 0   RESTASIS 0.05 % ophthalmic emulsion Place 1 drop into both eyes 2 (two) times daily.     UNABLE TO FIND Med Name: Stress J herbal supplement, 3 tablets oral daily as needed for anxiety     No facility-administered medications prior to visit.    No Known Allergies  ROS Review of Systems    Objective:    Physical Exam  BP (!) 120/58   Pulse (!) 50   LMP 07/18/2014   SpO2 100%  Wt Readings from Last 3 Encounters:  12/19/20 116 lb 12.8 oz (53 kg)  10/18/20 114 lb 6.4 oz (51.9 kg)  09/21/20 112 lb 14.4 oz (51.2 kg)     Health Maintenance Due  Topic Date Due   PAP SMEAR-Modifier  Never done   Zoster Vaccines- Shingrix (2 of 2) 11/16/2020    There are no preventive care reminders to display for this  patient.  Lab Results  Component Value Date   TSH 1.32 09/21/2020   Lab Results  Component Value Date   WBC 5.6 09/21/2020   HGB 13.4 09/21/2020   HCT 40.2 09/21/2020   MCV 89.9 09/21/2020   PLT 169 09/21/2020   Lab Results  Component Value Date   NA 134 (L) 09/21/2020   K 3.7 09/21/2020   CO2 30 09/21/2020   GLUCOSE 93 09/21/2020   BUN 16 09/21/2020   CREATININE 0.75 09/21/2020   BILITOT 0.2 09/21/2020   AST 18 09/21/2020   ALT 6 09/21/2020   PROT 6.9 09/21/2020   CALCIUM 9.0 09/21/2020   No results found for: CHOL No results found for: HDL No results found for: LDLCALC No results found for: TRIG No results found for: CHOLHDL No results found for: EKCM0L    Assessment & Plan:  PPD placement - Patient tolerated injection well without complications. Patient advised to schedule next injection 2-3 days from today.    Problem List Items Addressed This Visit   None Visit Diagnoses      Screening-pulmonary TB    -  Primary   Relevant Orders   PPD (Completed)       No orders of the defined types were placed in this encounter.   Follow-up: Return in about 2 days (around 06/26/2021) for PPD read and evaluation for paperwork. Earna Coder, Janalyn Harder, CMA

## 2021-06-26 ENCOUNTER — Other Ambulatory Visit: Payer: Self-pay

## 2021-06-26 ENCOUNTER — Encounter: Payer: Self-pay | Admitting: Medical-Surgical

## 2021-06-26 ENCOUNTER — Ambulatory Visit: Payer: BC Managed Care – PPO

## 2021-06-26 ENCOUNTER — Ambulatory Visit (INDEPENDENT_AMBULATORY_CARE_PROVIDER_SITE_OTHER): Payer: BC Managed Care – PPO | Admitting: Medical-Surgical

## 2021-06-26 VITALS — BP 130/81 | HR 62 | Resp 20 | Wt 116.0 lb

## 2021-06-26 DIAGNOSIS — F419 Anxiety disorder, unspecified: Secondary | ICD-10-CM | POA: Diagnosis not present

## 2021-06-26 DIAGNOSIS — Z0289 Encounter for other administrative examinations: Secondary | ICD-10-CM | POA: Diagnosis not present

## 2021-06-26 DIAGNOSIS — Z111 Encounter for screening for respiratory tuberculosis: Secondary | ICD-10-CM | POA: Diagnosis not present

## 2021-06-26 LAB — TB SKIN TEST
Induration: 0 mm
TB Skin Test: NEGATIVE

## 2021-06-26 NOTE — Progress Notes (Signed)
  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 Has a form with her to provide medical clearance. Had her TB skin test placed and is due to have that read today.   Doing well on HCTZ as prescribed. Blood pressure at goal today.   Taking Lexapro 2.5mg  daily, tolerating well without side effects. Feels this keeps her mood stable and is working well. Has hydroxyzine that she uses prn and reports only taking about 4-5 doses.   I reviewed the past medical history, family history, social history, surgical history, and allergies today and no changes were needed.  Please see the problem list section below in epic for further details.  Physical exam:   General: Well Developed, well nourished, and in no acute distress.  Neuro: Alert and oriented x3.  HEENT: Normocephalic, atraumatic.  Skin: Warm and dry. Cardiac: Regular rate and rhythm, no murmurs rubs or gallops, no lower extremity edema.  Respiratory: Clear to auscultation bilaterally. Not using accessory muscles, speaking in full sentences.  Impression and Recommendations:    1. Encounter for completion of form with patient Form completed and original copy provided to patient. Copy made for scanning into the medical record.   2. Screening-pulmonary TB TB skin test negative.   3. Anxiety  Continue lexapro and hydroxyzine as prescribed.   Return if symptoms worsen or fail to improve. ___________________________________________ Thayer Ohm, DNP, APRN, FNP-BC Primary Care and Sports Medicine [facility]

## 2021-07-03 ENCOUNTER — Telehealth: Payer: Self-pay

## 2021-07-03 NOTE — Telephone Encounter (Signed)
Sierra Scott called and stated that she pulled a tick off her scalp today. She would like to know if there's anything she should do. Please advise.

## 2021-07-03 NOTE — Telephone Encounter (Signed)
Pt.notified

## 2021-07-30 ENCOUNTER — Encounter: Payer: Self-pay | Admitting: Medical-Surgical

## 2021-11-12 ENCOUNTER — Ambulatory Visit: Payer: BC Managed Care – PPO | Admitting: Orthopaedic Surgery

## 2021-11-18 ENCOUNTER — Encounter: Payer: Self-pay | Admitting: Medical-Surgical

## 2021-11-20 ENCOUNTER — Ambulatory Visit (INDEPENDENT_AMBULATORY_CARE_PROVIDER_SITE_OTHER): Payer: Worker's Compensation | Admitting: Orthopaedic Surgery

## 2021-11-20 ENCOUNTER — Encounter: Payer: Self-pay | Admitting: Orthopaedic Surgery

## 2021-11-20 ENCOUNTER — Ambulatory Visit: Payer: Self-pay

## 2021-11-20 ENCOUNTER — Other Ambulatory Visit: Payer: Self-pay

## 2021-11-20 VITALS — BP 134/81 | Ht 65.5 in | Wt 118.0 lb

## 2021-11-20 DIAGNOSIS — M549 Dorsalgia, unspecified: Secondary | ICD-10-CM | POA: Diagnosis not present

## 2021-11-20 NOTE — Progress Notes (Signed)
Office Visit Note   Patient: Sierra Scott           Date of Birth: 1961/05/30           MRN: [id number] Visit Date: 11/20/2021              Requested by: Christen Butter, NP [address] [place],  [place] [address] PCP: Christen Butter, NP   Assessment & Plan: Visit Diagnoses:  1. Mid back pain     Plan: Patient with thoracic pain post fall likely contusion.  Her symptoms are improved x-rays show no acute compression or subacute compression.  She is released from care and can return on an as-needed basis.  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  Follow-Up Instructions: No follow-ups on file.   Orders:  Orders Placed This Encounter  Procedures   XR Thoracic Spine 2 View   No orders of the defined types were placed in this encounter.     Procedures: No procedures performed   Clinical Data: No additional findings.   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  She had some pain mid thoracic region and she states she is probably 80% better at this point.  She did initially use some ice.  She brought up brace with her own money off of [employer] that she wore for 2 months which seemed to help.  She denies pain with coughing.  No myelopathic symptoms.  Sometimes if she is lifting 2 children at 1 she has had some discomfort in her back at the end of the day.  She did take some ibuprofen in the past with relief.  No associated bowel or bladder symptoms.  No past history of thoracic injuries.  On previous x-rays there was concern for possible T3 compression.  Review of Systems all other systems noncontributory to HPI.   Objective: Vital Signs: BP 134/81    Ht 5' 5.5" (1.664 m)    Wt 118 lb (53.5 kg)     LMP 07/18/2014    BMI 19.34 kg/m   Physical Exam Constitutional:      Appearance: She is well-developed.  HENT:     Head: Normocephalic.     Right Ear: External ear normal.     Left Ear: External ear normal. There is no impacted cerumen.  Eyes:     Pupils: Pupils are equal, round, and reactive to light.  Neck:     Thyroid: No thyromegaly.     Trachea: No tracheal deviation.  Cardiovascular:     Rate and Rhythm: Normal rate.  Pulmonary:     Effort: Pulmonary effort is normal.  Abdominal:     Palpations: Abdomen is soft.  Musculoskeletal:     Cervical back: No rigidity.  Skin:    General: Skin is warm and dry.  Neurological:     Mental Status: She is alert and oriented to person, place, and time.  Psychiatric:        Behavior: Behavior normal.    Ortho Exam patient has normal heel toe gait no pain with lumbar extension or flexion.  Lateral bending is normal.  Spine is straight pelvis is level.  Specialty Comments:  No specialty comments  available.  Imaging: No results found.   PMFS History: There are no problems to display for this patient.  Past Medical History:  Diagnosis Date   Hypertension    Migraine     Family History  Problem Relation Age of Onset   CAD Mother    CAD Father    Stroke Father     Past Surgical History:  Procedure Laterality Date   No prior surgery     Social History   Occupational History    Comment: Teacher  Tobacco Use   Smoking status: Never   Smokeless tobacco: Never  Substance and Sexual Activity   Alcohol use: Yes    Alcohol/week: 1.0 - 2.0 standard drink    Types: 1 - 2 Glasses of wine per week    Comment: Occasional   Drug use: Never   Sexual activity: Yes    Birth control/protection: Surgical, Post-menopausal    Comment: husband had a vasectomy

## 2022-01-21 LAB — HM MAMMOGRAPHY

## 2022-04-25 ENCOUNTER — Encounter: Payer: Self-pay | Admitting: Neurology

## 2022-04-29 ENCOUNTER — Encounter: Payer: Self-pay | Admitting: Medical-Surgical

## 2022-04-30 NOTE — Telephone Encounter (Signed)
Request for records for mammogram and pap smear faxed to [facility].  Tiajuana Amass, CMA

## 2022-08-16 ENCOUNTER — Encounter: Payer: Self-pay | Admitting: Medical-Surgical

## 2022-08-19 ENCOUNTER — Encounter: Payer: Self-pay | Admitting: Medical-Surgical
